# Patient Record
Sex: Female | Born: 1935 | Race: Black or African American | Hispanic: No
Health system: Southern US, Community
[De-identification: ages and names within clinical notes are randomized; demographics above are authoritative.]

---

## 2019-09-29 ENCOUNTER — Emergency Department (HOSPITAL_COMMUNITY)
Admission: EM | Admit: 2019-09-29 | Discharge: 2019-09-30 | Disposition: A | Discharge status: Home / Self Care | Payer: Medicare Other | Attending: Emergency Medicine | Admitting: Emergency Medicine

## 2019-09-29 ENCOUNTER — Other Ambulatory Visit: Payer: Self-pay

## 2019-09-29 ENCOUNTER — Emergency Department (HOSPITAL_COMMUNITY): Payer: Medicare Other

## 2019-09-29 ENCOUNTER — Encounter (HOSPITAL_COMMUNITY): Payer: Self-pay

## 2019-09-29 DIAGNOSIS — Z992 Dependence on renal dialysis: Secondary | ICD-10-CM | POA: Diagnosis not present

## 2019-09-29 DIAGNOSIS — N186 End stage renal disease: Secondary | ICD-10-CM | POA: Insufficient documentation

## 2019-09-29 DIAGNOSIS — R000 Tachycardia, unspecified: Secondary | ICD-10-CM | POA: Diagnosis present

## 2019-09-29 DIAGNOSIS — U071 COVID-19: Secondary | ICD-10-CM | POA: Diagnosis not present

## 2019-09-29 LAB — CBC WITH DIFFERENTIAL/PLATELET
Abs Immature Granulocytes: 0.04 10*3/uL (ref 0.00–0.07)
Basophils Absolute: 0 10*3/uL (ref 0.0–0.1)
Basophils Relative: 0 %
Eosinophils Absolute: 0 10*3/uL (ref 0.0–0.5)
Eosinophils Relative: 0 %
HCT: 27.7 % — ABNORMAL LOW (ref 36.0–46.0)
Hemoglobin: 9.1 g/dL — ABNORMAL LOW (ref 12.0–15.0)
Immature Granulocytes: 1 %
Lymphocytes Relative: 15 %
Lymphs Abs: 0.7 10*3/uL (ref 0.7–4.0)
MCH: 28.5 pg (ref 26.0–34.0)
MCHC: 32.9 g/dL (ref 30.0–36.0)
MCV: 86.8 fL (ref 80.0–100.0)
Monocytes Absolute: 0.4 10*3/uL (ref 0.1–1.0)
Monocytes Relative: 8 %
Neutro Abs: 3.3 10*3/uL (ref 1.7–7.7)
Neutrophils Relative %: 76 %
Platelets: 167 10*3/uL (ref 150–400)
RBC: 3.19 MIL/uL — ABNORMAL LOW (ref 3.87–5.11)
RDW: 18.1 % — ABNORMAL HIGH (ref 11.5–15.5)
WBC: 4.4 10*3/uL (ref 4.0–10.5)
nRBC: 0 % (ref 0.0–0.2)

## 2019-09-29 LAB — BASIC METABOLIC PANEL
Anion gap: 22 — ABNORMAL HIGH (ref 5–15)
BUN: 91 mg/dL — ABNORMAL HIGH (ref 8–23)
CO2: 25 mmol/L (ref 22–32)
Calcium: 7.5 mg/dL — ABNORMAL LOW (ref 8.9–10.3)
Chloride: 88 mmol/L — ABNORMAL LOW (ref 98–111)
Creatinine, Ser: 9.14 mg/dL — ABNORMAL HIGH (ref 0.44–1.00)
GFR calc Af Amer: 4 mL/min — ABNORMAL LOW (ref >60)
GFR calc non Af Amer: 3 mL/min — ABNORMAL LOW (ref >60)
Glucose, Bld: 200 mg/dL — ABNORMAL HIGH (ref 70–99)
Potassium: 5.3 mmol/L — ABNORMAL HIGH (ref 3.5–5.1)
Sodium: 135 mmol/L (ref 135–145)

## 2019-09-29 MED ORDER — METOPROLOL TARTRATE 25 MG PO TABS
25.0000 mg | ORAL_TABLET | Freq: Once | ORAL | Status: DC
  Filled 2019-09-29: qty 1

## 2019-09-29 MED ORDER — METOPROLOL TARTRATE 5 MG/5ML IV SOLN
5.0000 mg | Freq: Once | INTRAVENOUS | Status: AC
  Administered 2019-09-29: 5 mg via INTRAVENOUS
  Filled 2019-09-29: qty 5

## 2019-09-29 NOTE — ED Provider Notes (Signed)
[facility] EMERGENCY DEPARTMENT Provider Note   CSN: [id number] Arrival date & time: 09/29/19  2049     History   Chief Complaint Chief Complaint  Patient presents with   Tachycardia    HPI Barbara Allen is a 85 y.o. female.     Patient here from nursing home.  Has coronavirus.  Was just transferred to nursing home to be in the coronavirus unit for 2 weeks.  Patient was sent here because facility was unable to confirm dialysis plan for the patient.  They were unaware of the last time she had dialysis.  Patient has been without any issues otherwise.  The history is provided by the EMS personnel, the nursing home, the patient and a caregiver.  Illness Location:  General Severity:  Mild Timing:  Unable to specify Progression:  Unable to specify Chronicity:  New Associated symptoms: no abdominal pain, no chest pain, no cough, no ear pain, no fever, no rash, no shortness of breath, no sore throat and no vomiting     History reviewed. No pertinent past medical history.  There are no active problems to display for this patient.   History reviewed. No pertinent surgical history.   OB History   No obstetric history on file.      Home Medications    Prior to Admission medications   Not on File    Family History History reviewed. No pertinent family history.  Social History Social History   Tobacco Use   Smoking status: Not on file  Substance Use Topics   Alcohol use: Not on file   Drug use: Not on file     Allergies   Patient has no allergy information on record.   Review of Systems Review of Systems  Constitutional: Negative for chills and fever.  HENT: Negative for ear pain and sore throat.   Eyes: Negative for pain and visual disturbance.  Respiratory: Negative for cough and shortness of breath.   Cardiovascular: Negative for chest pain and palpitations.  Gastrointestinal: Negative for abdominal pain and vomiting.    Genitourinary: Negative for dysuria and hematuria.  Musculoskeletal: Negative for arthralgias and back pain.  Skin: Negative for color change and rash.  Neurological: Negative for seizures and syncope.  All other systems reviewed and are negative.    Physical Exam Updated Vital Signs  ED Triage Vitals  Enc Vitals Group     BP 09/29/19 2131 131/80     Pulse Rate 09/29/19 2101 (!) 125     Resp 09/29/19 2101 (!) 29     Temp --      Temp src --      SpO2 09/29/19 2101 100 %     Weight --      Height --      Head Circumference --      Peak Flow --      Pain Score 09/29/19 2054 0     Pain Loc --      Pain Edu? --      Excl. in [place]? --      Physical Exam Vitals signs and nursing note reviewed.  Constitutional:      General: She is not in acute distress.    Appearance: She is well-developed. She is not ill-appearing.  HENT:     Head: Normocephalic and atraumatic.     Nose: Nose normal.  Eyes:     Extraocular Movements: Extraocular movements intact.     Conjunctiva/sclera: Conjunctivae normal.  Pupils: Pupils are equal, round, and reactive to light.  Neck:     Musculoskeletal: Neck supple.  Cardiovascular:     Rate and Rhythm: Normal rate and regular rhythm.     Pulses: Normal pulses.     Heart sounds: Normal heart sounds. No murmur.  Pulmonary:     Effort: No respiratory distress.     Comments: Coarse breath sounds Abdominal:     Palpations: Abdomen is soft.     Tenderness: There is no abdominal tenderness.  Skin:    General: Skin is warm and dry.     Capillary Refill: Capillary refill takes less than 2 seconds.  Neurological:     General: No focal deficit present.     Mental Status: She is alert. Mental status is at baseline.      ED Treatments / Results  Labs (all labs ordered are listed, but only abnormal results are displayed) Labs Reviewed  CBC WITH DIFFERENTIAL/PLATELET - Abnormal; Notable for the following components:      Result Value   RBC  3.19 (*)    Hemoglobin 9.1 (*)    HCT 27.7 (*)    RDW 18.1 (*)    All other components within normal limits  BASIC METABOLIC PANEL - Abnormal; Notable for the following components:   Potassium 5.3 (*)    Chloride 88 (*)    Glucose, Bld 200 (*)    BUN 91 (*)    Creatinine, Ser 9.14 (*)    Calcium 7.5 (*)    GFR calc non Af Amer 3 (*)    GFR calc Af Amer 4 (*)    Anion gap 22 (*)    All other components within normal limits    EKG EKG Interpretation  Date/Time:  Friday September 29 2019 21:04:32 EST Ventricular Rate:  124 PR Interval:    QRS Duration: 79 QT Interval:  408 QTC Calculation: 587 R Axis:   96 Text Interpretation: Junctional tachycardia Anteroseptal infarct, age indeterminate Prolonged QT interval Confirmed by Lennice Sites [phone]) on 09/29/2019 9:10:30 PM   Radiology Dg Chest Portable 1 View  Result Date: 09/29/2019 CLINICAL DATA:  COVID positive EXAM: PORTABLE CHEST 1 VIEW COMPARISON:  None. FINDINGS: Cardiomegaly with vascular congestion. Multifocal airspace opacities right greater than left with consolidation at both bases. Cannot exclude pleural effusion. Aortic atherosclerosis. No pneumothorax. IMPRESSION: 1. Multifocal airspace opacities right greater than left with bibasilar consolidations, consistent with multifocal pneumonia. 2. Cardiomegaly with vascular congestion. Possible pleural effusions Electronically Signed   By: Donavan Foil M.D.   On: 09/29/2019 22:10    Procedures Procedures (including critical care time)  Medications Ordered in ED Medications  metoprolol tartrate (LOPRESSOR) tablet 25 mg (has no administration in time range)  metoprolol tartrate (LOPRESSOR) injection 5 mg (5 mg Intravenous Given 09/29/19 2128)     Initial Impression / Assessment and Plan / ED Course  I have reviewed the triage vital signs and the nursing notes.  Pertinent labs & imaging results that were available during my care of the patient were reviewed by me and  considered in my medical decision making (see chart for details).     Barbara Allen is a 85 year old female history of end-stage renal disease, tachycardia who presents to the ED from nursing home for evaluation for possible dialysis.  Patient with overall unremarkable vitals except for mild tachycardia.  Talked with the nursing home as patient was just transferred there today from her nursing home in [place] to be in a  coronavirus unit for the next 2 weeks.  She tested positive for coronavirus at her current facility.  It appears that there was not a plan for dialysis and facility was unable to figure out what her dialysis plan was and sent her for evaluation as they could not confirm when her last session was.  I talked with the son on the phone who thought that the plan was not to have dialysis possibly anymore.  Overall he is a poor historian.  He has not been able to see the patient.  But he was under the impression that patient was not supposed to have dialysis anymore.  Talked with our nephrologist who will talk to facility about further needs.  However, encourage facility to talk with family about goals of care.  Overall I believe that there has been miscommunication about long-term care of this patient.  Overall she appears well.  She is hemodynamically stable.  Potassium is unremarkable.  No significant anemia or electrolyte abnormality.  Chest x-ray seems consistent with coronavirus.  She is on her home oxygen.  No signs of respiratory distress.  She was given Lopressor and her home metoprolol and tachycardia improved.  She was sent back to her facility.  This chart was dictated using voice recognition software.  Despite best efforts to proofread,  errors can occur which can change the documentation meaning.    Final Clinical Impressions(s) / ED Diagnoses   Final diagnoses:  Tachycardia    ED Discharge Orders    None       Lennice Sites, DO 09/29/19 2234

## 2019-09-29 NOTE — ED Triage Notes (Signed)
Pt bib [facility] from [facility]. Pt is new resident at [facility] as of yesterday and staff is not able to find out when pt had last dialysis treatment. Pt is covid +, afebrile, tachycardic. Pt wears 3 lpm O2 via [place] at baseline.  EMS VS HR 120 RR 20 BP 126/90 TEMP 98.90F SPO2 95% on 3 lpm O2  via [place]

## 2019-09-29 NOTE — Discharge Instructions (Addendum)
Make sure patient takes her daily medications.  Nephrology will call for further information.  However, I suspect that maybe patient is no longer supposed to no longer be on dialysis.  Son was under the impression that patient is no longer supposed to be on dialysis.  I encouraged goals of care discussion with patient's son.  Overall there is no need for emergent dialysis at this time.

## 2019-09-29 NOTE — ED Notes (Signed)
CALLED [facility] FOR TRANSPORT TO [facility]--Kashaun Bebo  

## 2019-10-10 DEATH — deceased

## 2021-05-09 IMAGING — DX DG CHEST 1V PORT
1 series · 1 of 1 positions shown · non-contrast
Comparison: None.

CLINICAL DATA: COVID positive

EXAM:
PORTABLE CHEST 1 VIEW

[chest ap]
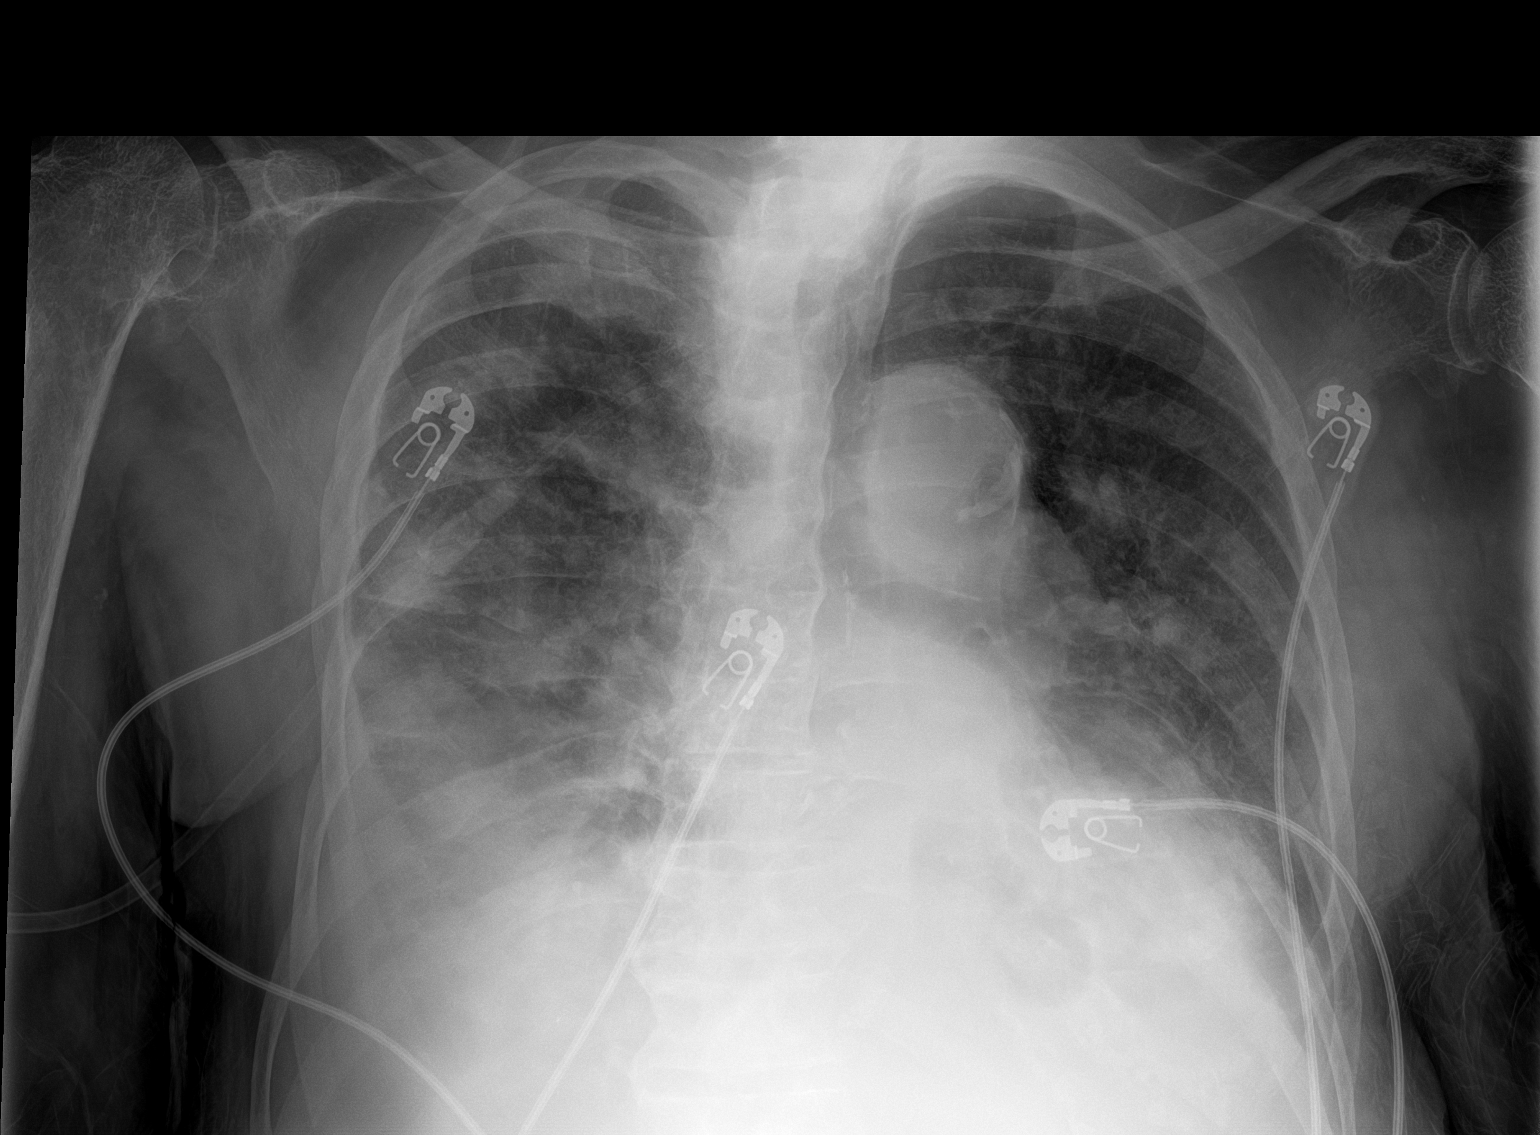

[1 of 1 positions shown; findings below may reference images not displayed]

FINDINGS: Cardiomegaly with vascular congestion. Multifocal airspace opacities
right greater than left with consolidation at both bases. Cannot
exclude pleural effusion. Aortic atherosclerosis. No pneumothorax.
IMPRESSION: 1. Multifocal airspace opacities right greater than left with
bibasilar consolidations, consistent with multifocal pneumonia.
2. Cardiomegaly with vascular congestion. Possible pleural effusions
